# Patient Record
Sex: Female | Born: 1975 | Hispanic: Yes | Marital: Married
Health system: Southern US, Community
[De-identification: ages and names within clinical notes are randomized; demographics above are authoritative.]

---

## 2006-11-15 ENCOUNTER — Emergency Department: Payer: Self-pay | Admitting: Emergency Medicine

## 2006-11-17 ENCOUNTER — Ambulatory Visit: Payer: Self-pay | Admitting: Emergency Medicine

## 2007-07-18 ENCOUNTER — Inpatient Hospital Stay: Payer: Self-pay | Admitting: Obstetrics and Gynecology

## 2008-03-23 ENCOUNTER — Ambulatory Visit: Payer: Self-pay | Admitting: Obstetrics and Gynecology

## 2010-04-24 ENCOUNTER — Ambulatory Visit: Payer: Self-pay | Admitting: Family Medicine

## 2012-06-15 ENCOUNTER — Ambulatory Visit: Payer: Self-pay

## 2012-06-18 ENCOUNTER — Ambulatory Visit: Payer: Self-pay

## 2013-05-21 ENCOUNTER — Emergency Department: Payer: Self-pay | Admitting: Emergency Medicine

## 2015-01-13 IMAGING — CT CT HEAD WITHOUT CONTRAST
1 series · 16 of 30 positions shown, 20 images · non-contrast
Comparison: none

REASON FOR EXAM: L sided ha, w/ dizziness
COMMENTS:   LMP: One week ago

[Series 2: soft tissue · axial · 0.40mm/px · z∈[+8,+142]mm · 16 of 30 slices shown, 20 images]
[im 2/30  brain]
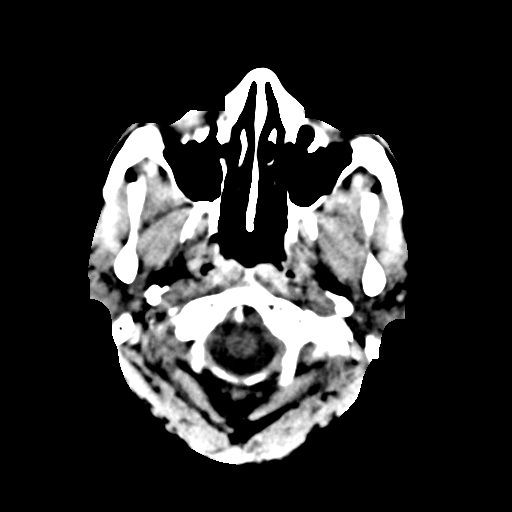
[im 2/30  bone]
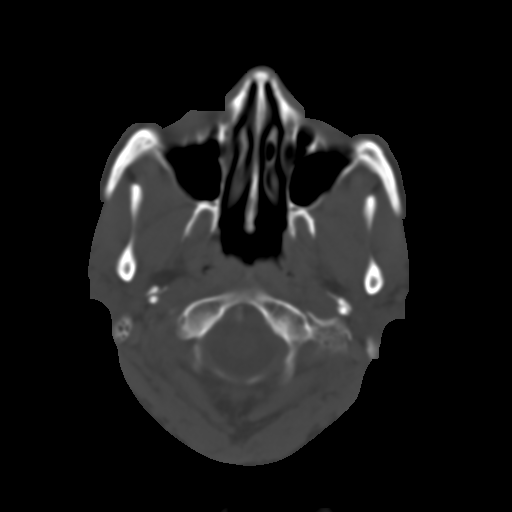
[im 4/30  brain]
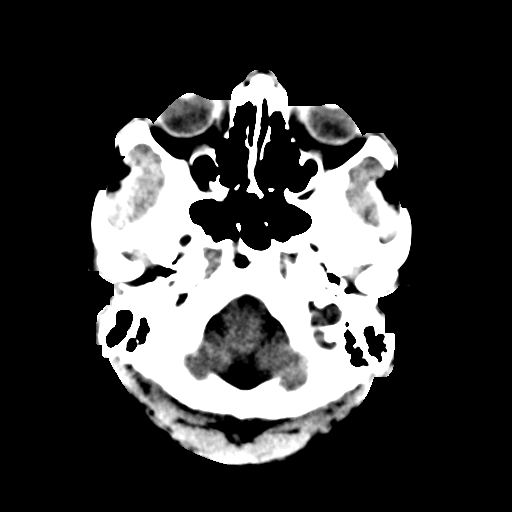
[im 6/30  brain]
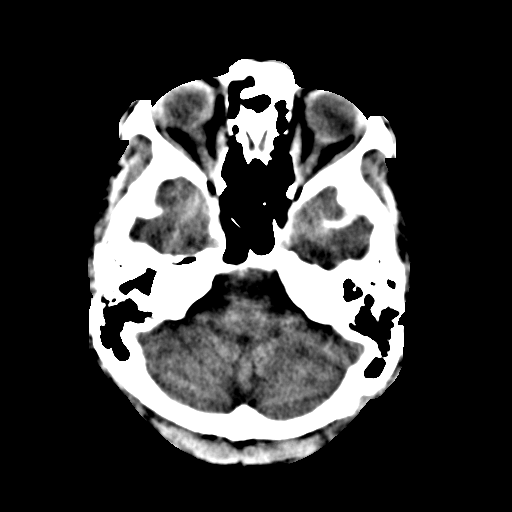
[im 8/30  brain]
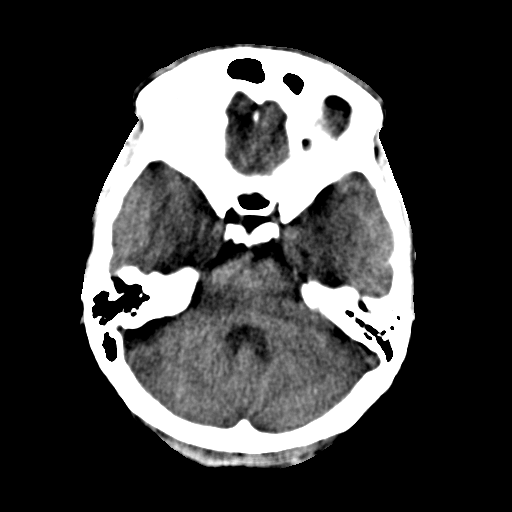
[im 9/30  brain]
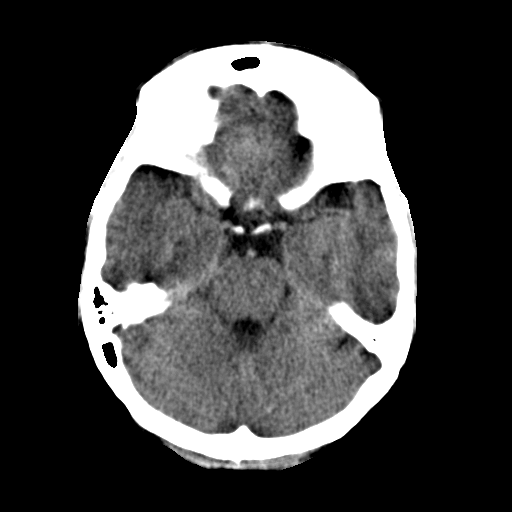
[im 9/30  bone]
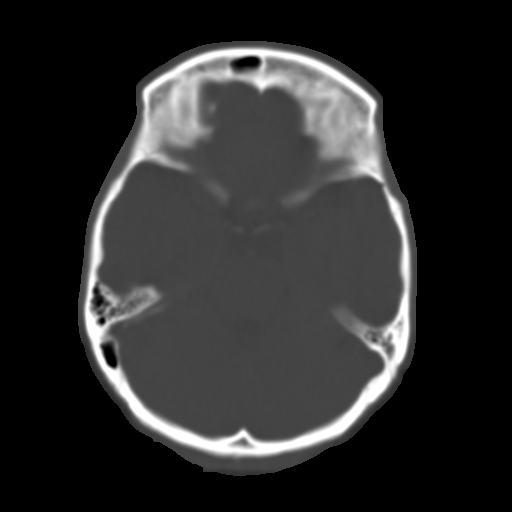
[im 11/30  brain]
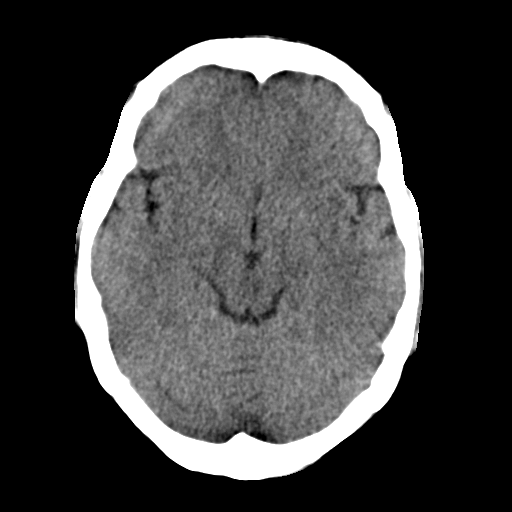
[im 13/30  brain]
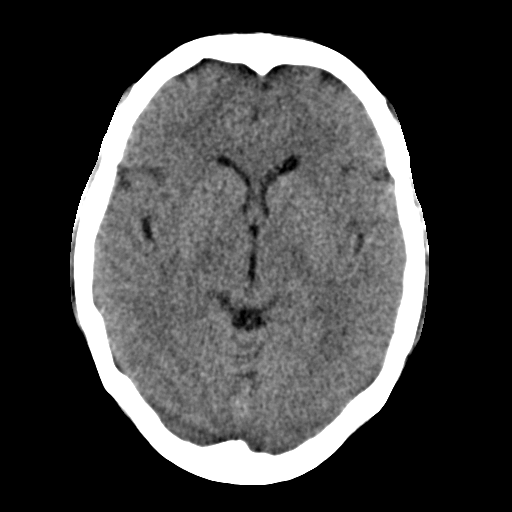
[im 15/30  brain]
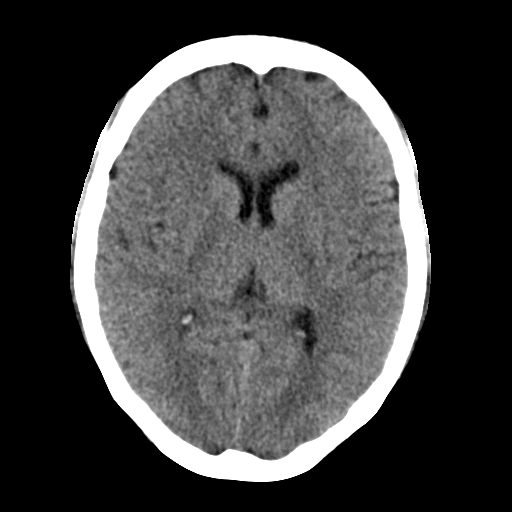
[im 16/30  brain]
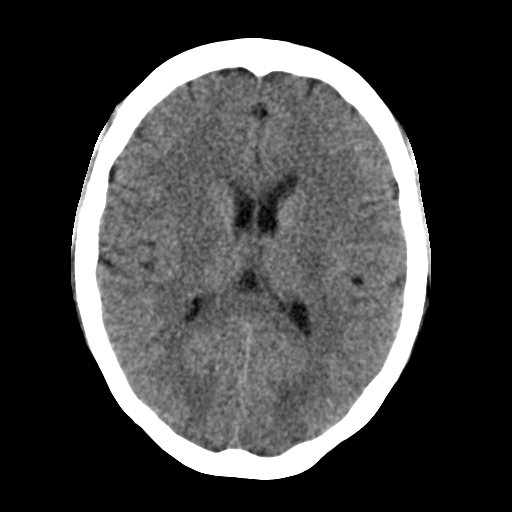
[im 16/30  bone]
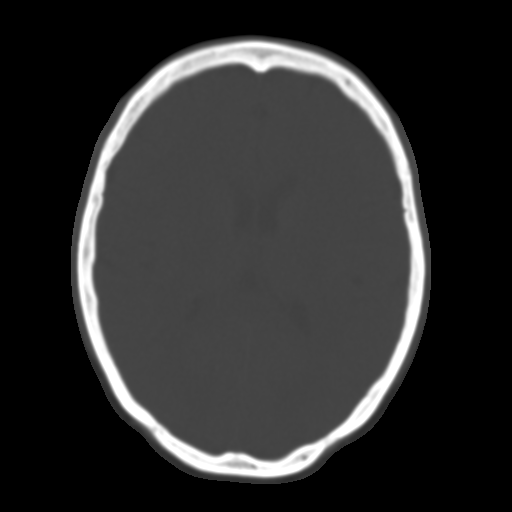
[im 18/30  brain]
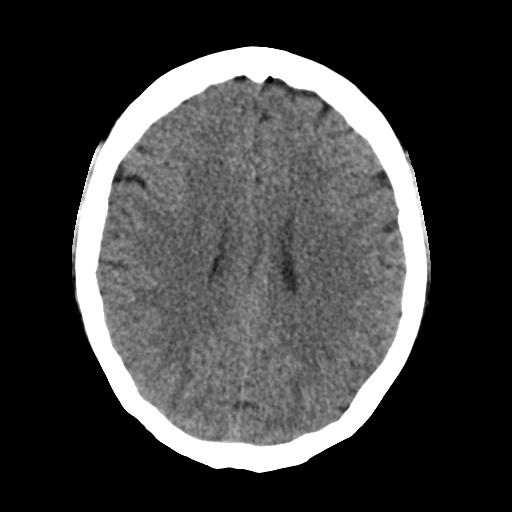
[im 20/30  brain]
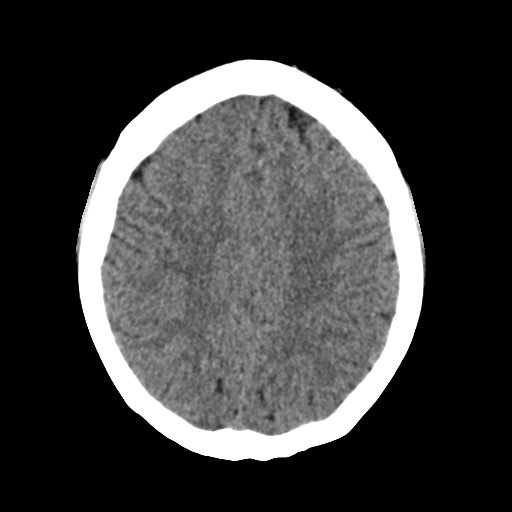
[im 22/30  brain]
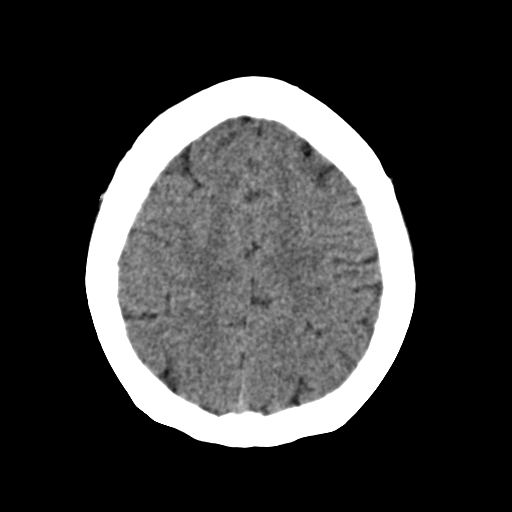
[im 23/30  brain]
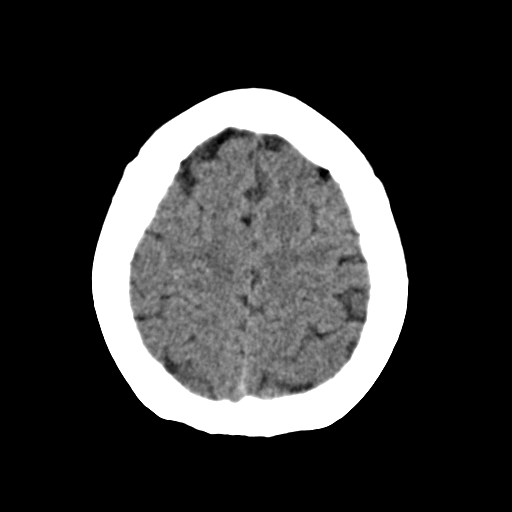
[im 23/30  bone]
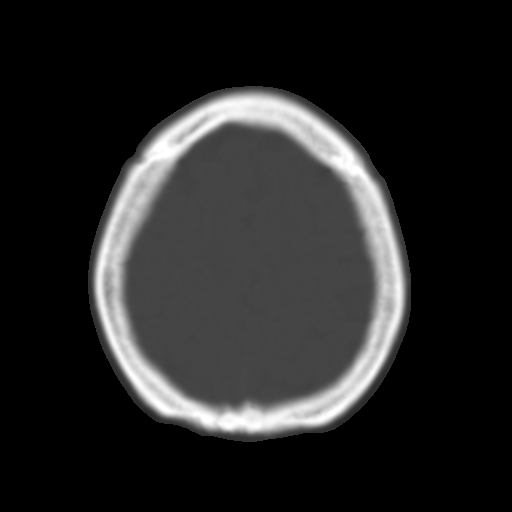
[im 25/30  brain]
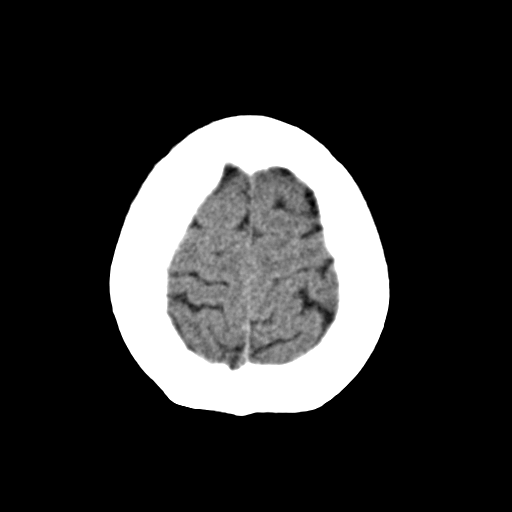
[im 27/30  brain]
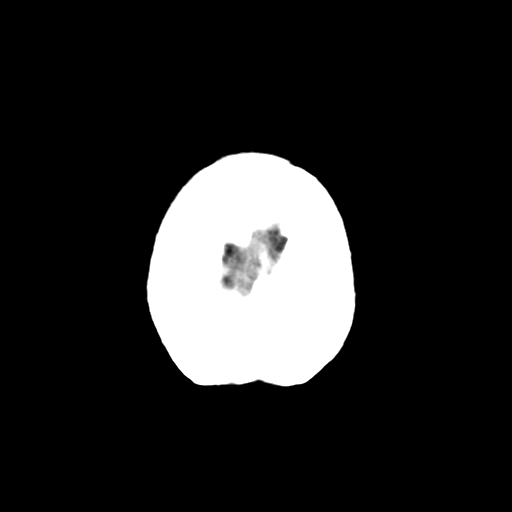
[im 29/30  brain]
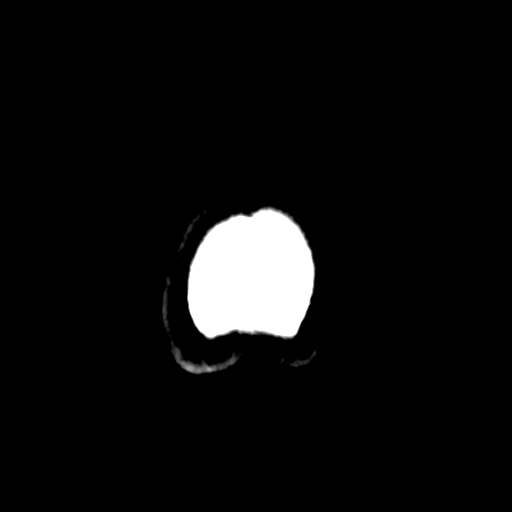

[16 of 30 positions shown; findings below may reference images not displayed]

PROCEDURE:     CT  - CT HEAD WITHOUT CONTRAST  - May 21, 2013  [DATE]

RESULT:     Axial noncontrast CT scanning was performed through the brain
with reconstructions at 5 mm intervals and slice thicknesses.

The ventricles are normal in size and position. There is no intracranial
hemorrhage nor intracranial mass effect. The cerebellum and brainstem are
normal in density.

At bone window settings the observed portions of the paranasal sinuses and
mastoid air cells are clear. There is no evidence of an acute skull fracture.
IMPRESSION: There is no acute intracranial abnormality.

[REDACTED]

## 2015-08-30 ENCOUNTER — Other Ambulatory Visit: Payer: Self-pay | Admitting: Obstetrics and Gynecology

## 2015-08-30 DIAGNOSIS — Z1231 Encounter for screening mammogram for malignant neoplasm of breast: Secondary | ICD-10-CM

## 2015-09-03 ENCOUNTER — Ambulatory Visit: Payer: Self-pay

## 2015-09-05 ENCOUNTER — Ambulatory Visit
Admission: RE | Admit: 2015-09-05 | Discharge: 2015-09-05 | Disposition: A | Discharge status: Home / Self Care | Payer: No Typology Code available for payment source | Source: Ambulatory Visit | Attending: Obstetrics and Gynecology | Admitting: Obstetrics and Gynecology

## 2015-09-05 DIAGNOSIS — Z1231 Encounter for screening mammogram for malignant neoplasm of breast: Secondary | ICD-10-CM | POA: Diagnosis not present

## 2020-01-08 ENCOUNTER — Ambulatory Visit: Payer: No Typology Code available for payment source | Attending: Internal Medicine

## 2020-01-08 DIAGNOSIS — Z23 Encounter for immunization: Secondary | ICD-10-CM

## 2020-01-08 NOTE — Progress Notes (Signed)
   [facility] Vaccination [facility]  Name:  Sue Daniels Daniels    MRN: [id number] DOB: [birth year]  01/08/2020  Ms. Sue Daniels was observed post Covid-19 immunization for 15 minutes without incident. She was provided with Vaccine Information Sheet and instruction to access the V-Safe system.   Ms. Sue Daniels was instructed to call 911 with any severe reactions post vaccine: Marland Kitchen Difficulty breathing  . Swelling of face and throat  . A fast heartbeat  . A bad rash all over body  . Dizziness and weakness   Immunizations Administered    Name Date Dose VIS Date Route   Pfizer COVID-19 Vaccine 01/08/2020 12:25 PM 0.3 mL 09/23/2019 Intramuscular   Manufacturer: [employer], [employer]   Lot: [id number]   NDC: [address]-0

## 2020-01-29 ENCOUNTER — Ambulatory Visit: Payer: No Typology Code available for payment source | Attending: Internal Medicine

## 2020-01-29 DIAGNOSIS — Z23 Encounter for immunization: Secondary | ICD-10-CM

## 2020-01-29 NOTE — Progress Notes (Signed)
   Covid-19 Vaccination Clinic  Name:  [name]    MRN: [id number] DOB: Jun 20, 1976  01/29/2020  Ms. Sue Daniels was observed post Covid-19 immunization for 30 minutes based on pre-vaccination screening without incident. She was provided with Vaccine Information Sheet and instruction to access the V-Safe system.   Ms. Sue Daniels was instructed to call 911 with any severe reactions post vaccine: Marland Kitchen Difficulty breathing  . Swelling of face and throat  . A fast heartbeat  . A bad rash all over body  . Dizziness and weakness   Immunizations Administered    Name Date Dose VIS Date Route   Pfizer COVID-19 Vaccine 01/29/2020 12:19 PM 0.3 mL 09/23/2019 Intramuscular   Manufacturer: [employer], [employer]   Lot: [id number]   NDC: [address]-0
